# Patient Record
Sex: Female | Born: 1985 | Race: Black or African American | Hispanic: Refuse to answer | Marital: Single | Smoking: Never smoker
Health system: Southern US, Community
[De-identification: ages and names within clinical notes are randomized; demographics above are authoritative.]

## PROBLEM LIST (undated history)

## (undated) DIAGNOSIS — D709 Neutropenia, unspecified: Secondary | ICD-10-CM

## (undated) HISTORY — PX: WISDOM TOOTH EXTRACTION: SHX21

## (undated) HISTORY — PX: OTHER SURGICAL HISTORY: SHX169

## (undated) HISTORY — DX: Neutropenia, unspecified: D70.9

---

## 2014-02-07 ENCOUNTER — Other Ambulatory Visit (HOSPITAL_COMMUNITY): Payer: Self-pay | Admitting: Family Medicine

## 2014-02-07 DIAGNOSIS — IMO0002 Reserved for concepts with insufficient information to code with codable children: Secondary | ICD-10-CM

## 2014-02-07 DIAGNOSIS — R229 Localized swelling, mass and lump, unspecified: Principal | ICD-10-CM

## 2014-02-14 ENCOUNTER — Other Ambulatory Visit (HOSPITAL_COMMUNITY): Payer: Self-pay | Admitting: Family Medicine

## 2014-02-14 ENCOUNTER — Ambulatory Visit (HOSPITAL_COMMUNITY)
Admission: RE | Admit: 2014-02-14 | Discharge: 2014-02-14 | Disposition: A | Discharge status: Home / Self Care | Payer: 59 | Source: Ambulatory Visit | Attending: Family Medicine | Admitting: Family Medicine

## 2014-02-14 DIAGNOSIS — IMO0002 Reserved for concepts with insufficient information to code with codable children: Secondary | ICD-10-CM

## 2014-02-14 DIAGNOSIS — N63 Unspecified lump in breast: Secondary | ICD-10-CM | POA: Insufficient documentation

## 2014-02-14 DIAGNOSIS — R229 Localized swelling, mass and lump, unspecified: Principal | ICD-10-CM

## 2014-09-25 ENCOUNTER — Other Ambulatory Visit (HOSPITAL_COMMUNITY): Payer: Self-pay | Admitting: Family Medicine

## 2014-09-25 DIAGNOSIS — N63 Unspecified lump in unspecified breast: Secondary | ICD-10-CM

## 2014-10-17 ENCOUNTER — Ambulatory Visit (HOSPITAL_COMMUNITY)
Admission: RE | Admit: 2014-10-17 | Discharge: 2014-10-17 | Disposition: A | Discharge status: Home / Self Care | Payer: 59 | Source: Ambulatory Visit | Attending: Family Medicine | Admitting: Family Medicine

## 2014-10-17 ENCOUNTER — Encounter (HOSPITAL_COMMUNITY): Payer: 59

## 2014-10-17 ENCOUNTER — Other Ambulatory Visit (HOSPITAL_COMMUNITY): Payer: Self-pay | Admitting: Physician Assistant

## 2014-10-17 ENCOUNTER — Ambulatory Visit (HOSPITAL_COMMUNITY)
Admission: RE | Admit: 2014-10-17 | Discharge: 2014-10-17 | Disposition: A | Discharge status: Home / Self Care | Payer: 59 | Source: Ambulatory Visit | Attending: Physician Assistant | Admitting: Physician Assistant

## 2014-10-17 ENCOUNTER — Ambulatory Visit (HOSPITAL_COMMUNITY): Payer: 59

## 2014-10-17 ENCOUNTER — Other Ambulatory Visit (HOSPITAL_COMMUNITY): Payer: Self-pay | Admitting: Family Medicine

## 2014-10-17 DIAGNOSIS — N63 Unspecified lump in unspecified breast: Secondary | ICD-10-CM

## 2014-10-17 DIAGNOSIS — N6310 Unspecified lump in the right breast, unspecified quadrant: Secondary | ICD-10-CM

## 2014-10-17 MED ORDER — LIDOCAINE-EPINEPHRINE 1 %-1:200000 IJ SOLN
INTRAMUSCULAR | Status: AC
Start: 2014-10-17 — End: 2014-10-17
  Administered 2014-10-17: 10 mL
  Filled 2014-10-17: qty 10

## 2014-10-17 MED ORDER — LIDOCAINE HCL (PF) 2 % IJ SOLN
INTRAMUSCULAR | Status: AC
  Filled 2014-10-17: qty 10

## 2014-10-17 NOTE — Discharge Instructions (Signed)
Breast Biopsy °Care After °These instructions give you information on caring for yourself after your procedure. Your doctor may also give you more specific instructions. Call your doctor if you have any problems or questions after your procedure. °HOME CARE °· Only take medicine as told by your doctor. °· Do not take aspirin. °· Keep your sutures (stitches) dry when bathing. °· Protect the biopsy area. Do not let the area get bumped. °· Avoid activities that could pull the biopsy site open until your doctor approves. This includes: °· Stretching. °· Reaching. °· Exercise. °· Sports. °· Lifting more than 3lb. °· Continue your normal diet. °· Wear a good support bra for as long as told by your doctor. °· Change any bandages (dressings) as told by your doctor. °· Do not drink alcohol while taking pain medicine. °· Keep all doctor visits as told. Ask when your test results will be ready. Make sure you get your test results. °GET HELP RIGHT AWAY IF:  °· You have a fever. °· You have more bleeding (more than a small spot) from the biopsy site. °· You have trouble breathing. °· You have yellowish-white fluid (pus) coming from the biopsy site. °· You have redness, puffiness (swelling), or more pain in the biopsy site. °· You have a bad smell coming from the biopsy site. °· Your biopsy site opens after sutures, staples, or sticky strips have been removed. °· You have a rash. °· You need stronger medicine. °MAKE SURE YOU: °· Understand these instructions. °· Will watch your condition. °· Will get help right away if you are not doing well or get worse. °Document Released: 02/22/2009 Document Revised: 07/21/2011 Document Reviewed: 06/08/2011 °ExitCare® Patient Information ©2015 [employer]. This information is not intended to replace advice given to you by your health care provider. Make sure you discuss any questions you have with your health care provider. ° °Breast Biopsy °A breast biopsy is a test during which a sample of  tissue is taken from your breast. The breast tissue is looked at under a microscope for cancer cells.  °BEFORE THE PROCEDURE °· Make plans to have someone drive you home after the test. °· Do not smoke for 2 weeks before the test. Stop smoking, if you smoke. °· Do not drink alcohol for 24 hours before the test. °· Wear a good support bra to the test. °PROCEDURE  °You may be given one of the following: °· A medicine to numb the breast area (local anesthetic). °· A medicine to make you fall asleep (general anesthetic). °There are different types of breast biopsies. They include: °· Fine-needle aspiration. °¨ A needle is put into the breast lump. °¨ The needle takes out fluid and cells from the lump. °¨ Ultrasound imaging may be used to help find the lump and to put the needle in the right spot. °· Core-needle biopsy. °¨ A needle is put into the breast lump. °¨ The needle is put in your breast 3-6 times. °¨ The needle removes breast tissue. °¨ An ultrasound image or X-ray is often used to find the right spot to put in the needle. °· Stereotactic biopsy. °¨ X-rays and a computer are used to study X-ray pictures of the breast lump. °¨ The computer finds where the needle needs to be put into the breast. °¨ Tissue samples are taken out. °· Vacuum-assisted biopsy. °¨ A small cut (incision) is made in your breast. °¨ A biopsy device is put through the cut and into the breast   tissue. °¨ The biopsy device draws abnormal breast tissue into the biopsy device. °¨ A large tissue sample is often removed. °¨ No stitches are needed. °· Ultrasound-guided core-needle biopsy. °¨ Ultrasound imaging helps guide the needle into the area of the breast that is not normal. °¨ A cut is made in the breast. The needle is put into the breast lump. °¨ Tissue samples are taken out. °· Open biopsy. °¨ A large cut is made in the breast. °¨ Your doctor will try to remove the whole breast lump or as much as possible. °All tissue, fluid, or cell samples  are looked at under a microscope.  °AFTER THE PROCEDURE °· You will be taken to an area to recover. You will be able to go home once you are doing well and are without problems. °· You may have bruising on your breast. This is normal. °· A pressure bandage (dressing) may be put on your breast for 24-48 hours. This type of bandage is wrapped tightly around your chest. It helps stop fluid from building up underneath tissues. °Document Released: 07/21/2011 Document Revised: 09/12/2013 Document Reviewed: 07/21/2011 °ExitCare® Patient Information ©2015 [employer]. This information is not intended to replace advice given to you by your health care provider. Make sure you discuss any questions you have with your health care provider. ° °

## 2014-11-27 IMAGING — US US BREAST LTD UNI RIGHT INC AXILLA
1 series · 2 of 2 positions shown · non-contrast
Comparison: None.

CLINICAL DATA: 28-year-old female complaining of a palpable
abnormality in the 1 o'clock subareolar region of the right breast.

EXAM:
ULTRASOUND OF THE RIGHT BREAST

[Series 1: us breast ltd uni right inc axilla · 0.05mm/px · 2 of 2 slices shown]
[im 1/2]
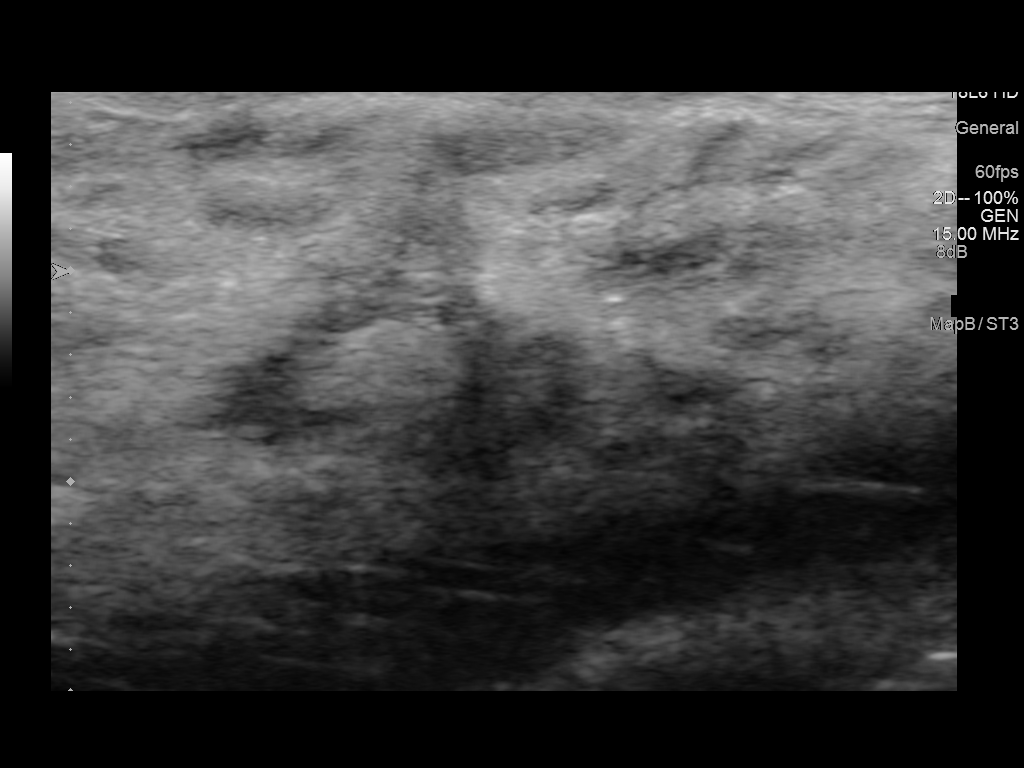
[im 2/2]
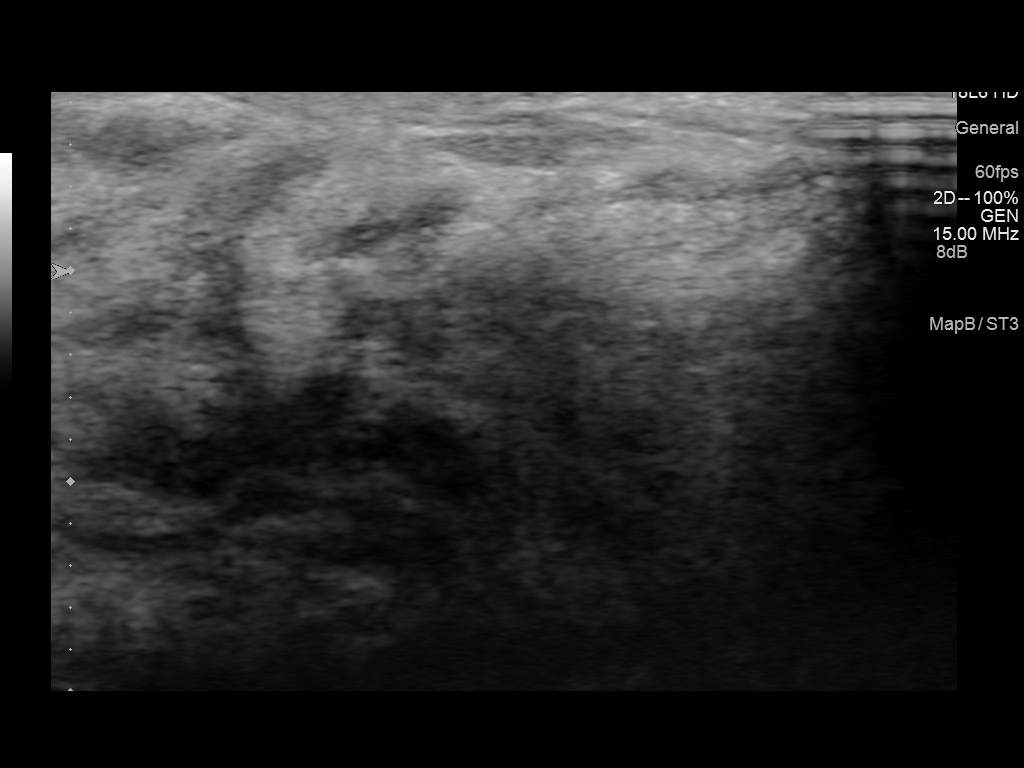

[2 of 2 positions shown; findings below may reference images not displayed]

FINDINGS: On physical exam, I palpate mild thickening in the right breast at 1
o'clock in the subareolar location.

Ultrasound is performed, showing normal tissue in the 1 o'clock
subareolar region of the right breast. No solid or cystic mass,
abnormal shadowing or distortion visualized.
IMPRESSION: No sonographic evidence of malignancy in the right breast.

RECOMMENDATION:
Short-term interval followup clinical exam and ultrasound of the
right breast in 6 months is recommended for the palpable thickening.
The importance of self-breast examination was discussed with the
patient.

I have discussed the findings and recommendations with the patient.
Results were also provided in writing at the conclusion of the
visit. If applicable, a reminder letter will be sent to the patient
regarding the next appointment.

BI-RADS CATEGORY  3: Probably benign finding(s) - short interval
follow-up suggested.

## 2015-07-27 DIAGNOSIS — Z0000 Encounter for general adult medical examination without abnormal findings: Secondary | ICD-10-CM | POA: Diagnosis not present

## 2015-08-01 DIAGNOSIS — H5213 Myopia, bilateral: Secondary | ICD-10-CM | POA: Diagnosis not present

## 2015-08-07 DIAGNOSIS — Z1389 Encounter for screening for other disorder: Secondary | ICD-10-CM | POA: Diagnosis not present

## 2015-08-07 DIAGNOSIS — Z0000 Encounter for general adult medical examination without abnormal findings: Secondary | ICD-10-CM | POA: Diagnosis not present

## 2015-08-07 DIAGNOSIS — N6021 Fibroadenosis of right breast: Secondary | ICD-10-CM | POA: Diagnosis not present

## 2015-08-08 ENCOUNTER — Other Ambulatory Visit (HOSPITAL_COMMUNITY): Payer: Self-pay | Admitting: Physician Assistant

## 2015-08-08 DIAGNOSIS — N6310 Unspecified lump in the right breast, unspecified quadrant: Secondary | ICD-10-CM

## 2015-08-14 ENCOUNTER — Other Ambulatory Visit (HOSPITAL_COMMUNITY): Payer: 59

## 2015-08-14 ENCOUNTER — Ambulatory Visit (HOSPITAL_COMMUNITY)
Admission: RE | Admit: 2015-08-14 | Discharge: 2015-08-14 | Disposition: A | Discharge status: Home / Self Care | Payer: 59 | Source: Ambulatory Visit | Attending: Physician Assistant | Admitting: Physician Assistant

## 2015-08-14 DIAGNOSIS — N63 Unspecified lump in breast: Secondary | ICD-10-CM | POA: Insufficient documentation

## 2015-08-14 DIAGNOSIS — N6310 Unspecified lump in the right breast, unspecified quadrant: Secondary | ICD-10-CM

## 2016-09-25 DIAGNOSIS — H5213 Myopia, bilateral: Secondary | ICD-10-CM | POA: Diagnosis not present

## 2016-09-25 DIAGNOSIS — H52223 Regular astigmatism, bilateral: Secondary | ICD-10-CM | POA: Diagnosis not present

## 2016-12-10 DIAGNOSIS — L42 Pityriasis rosea: Secondary | ICD-10-CM | POA: Diagnosis not present

## 2016-12-10 DIAGNOSIS — Z681 Body mass index (BMI) 19 or less, adult: Secondary | ICD-10-CM | POA: Diagnosis not present

## 2017-05-04 IMAGING — US US BREAST LTD UNI RIGHT INC AXILLA
1 series · 9 of 9 positions shown · non-contrast
Comparison: Previous exam(s).

CLINICAL DATA: Patient returns for follow-up of the right breast.
She reports improvement in the thickening of the retroareolar region
of the right breast but reports that the area of concern has moved
to lower portion of the right breast. She reports no history of
breast feeding or pregnancy. Minimal tenderness.

EXAM:
ULTRASOUND OF THE RIGHT BREAST

[Series 1: us breast ltd uni right inc axilla · 0.06mm/px · 9 of 9 slices shown]
[im 1/9]
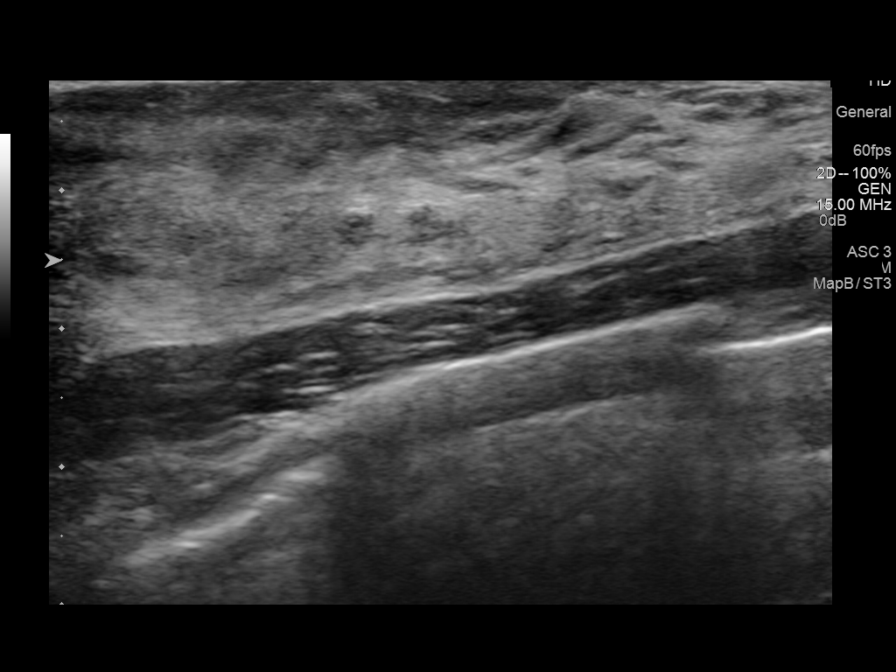
[im 2/9]
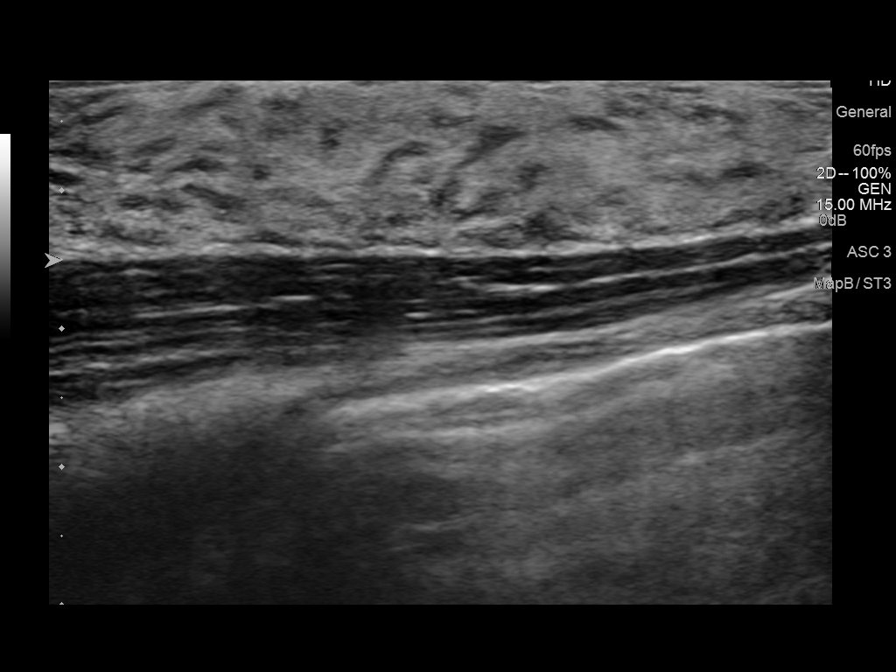
[im 3/9]
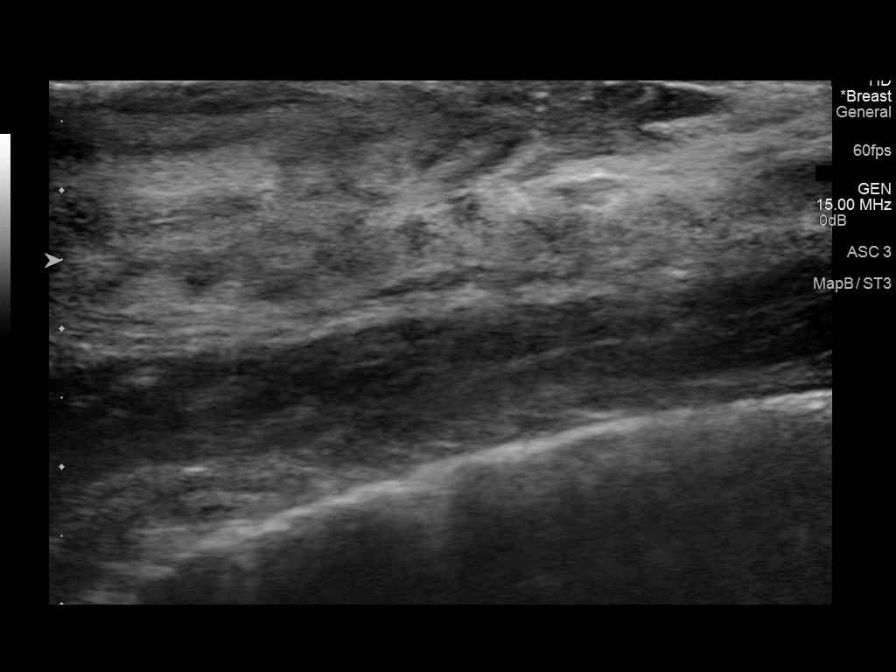
[im 4/9]
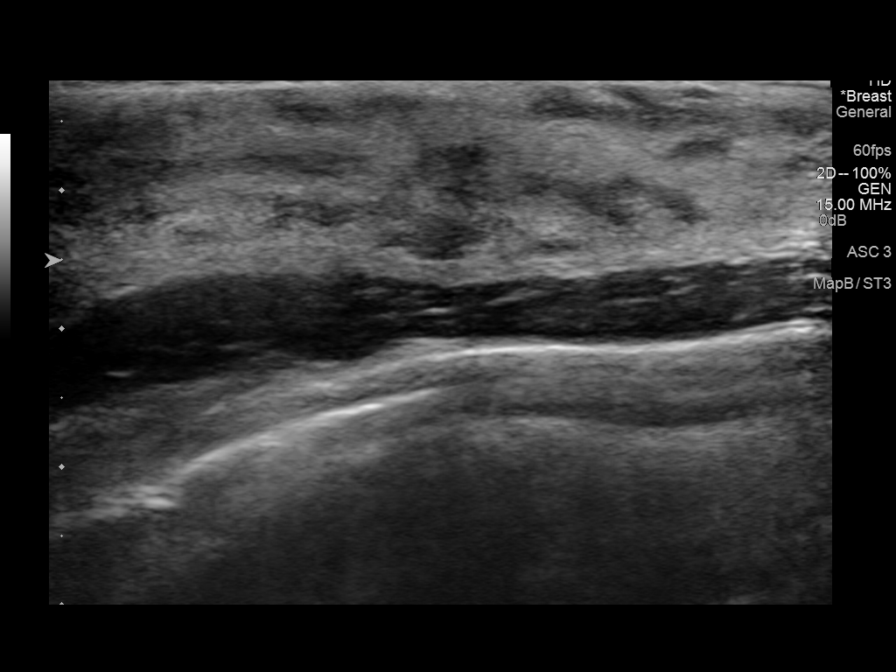
[im 5/9]
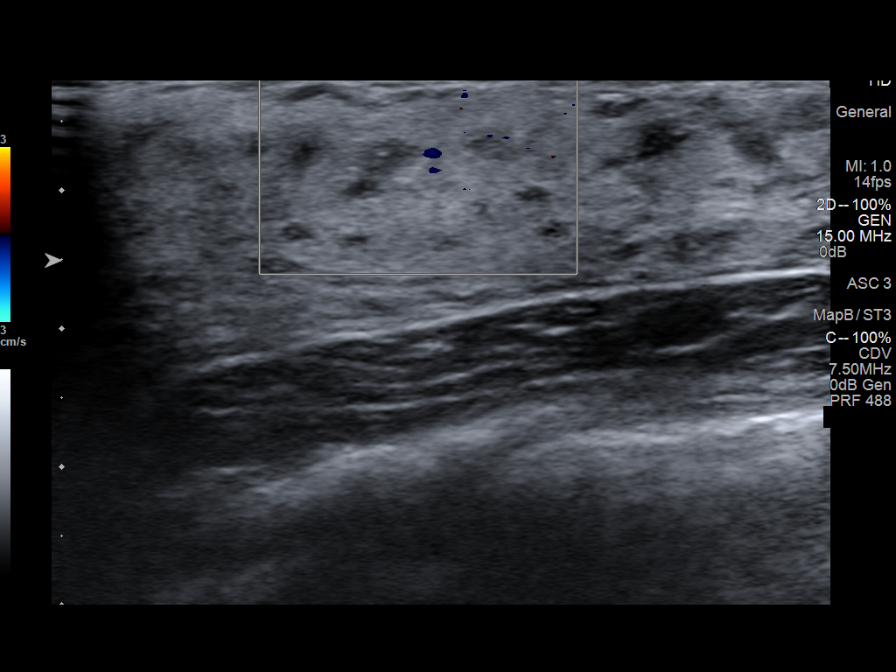
[im 6/9]
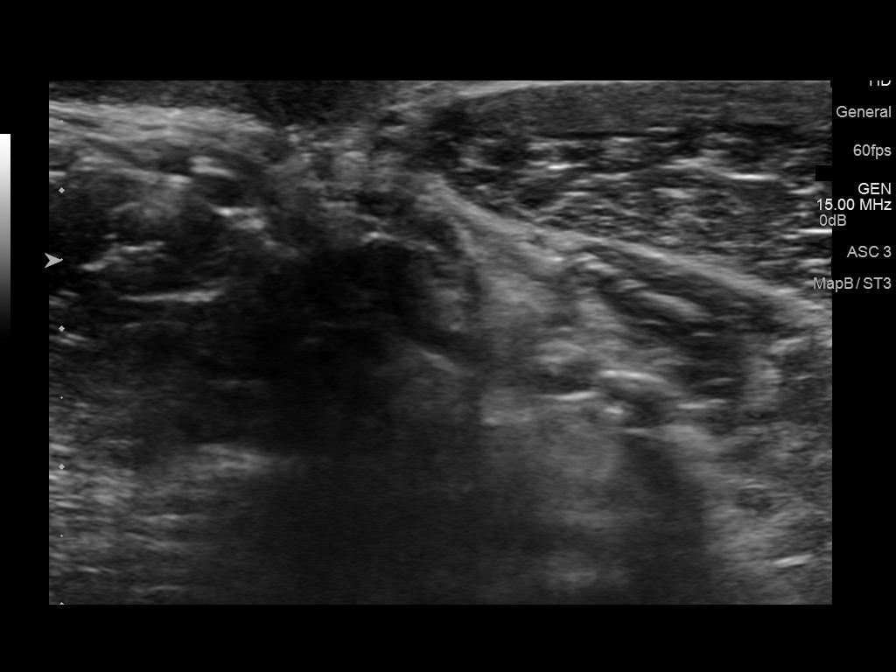
[im 7/9]
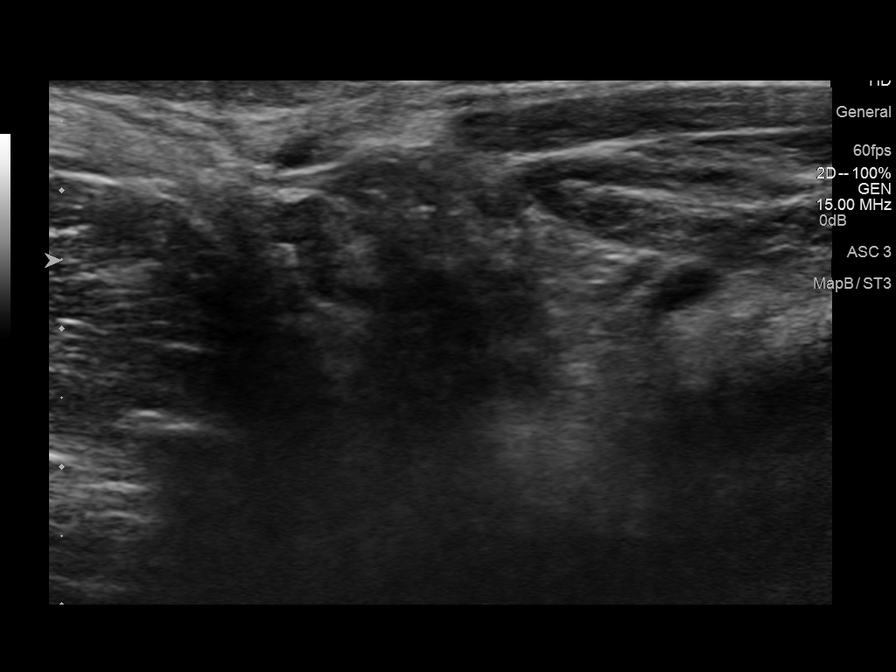
[im 8/9]
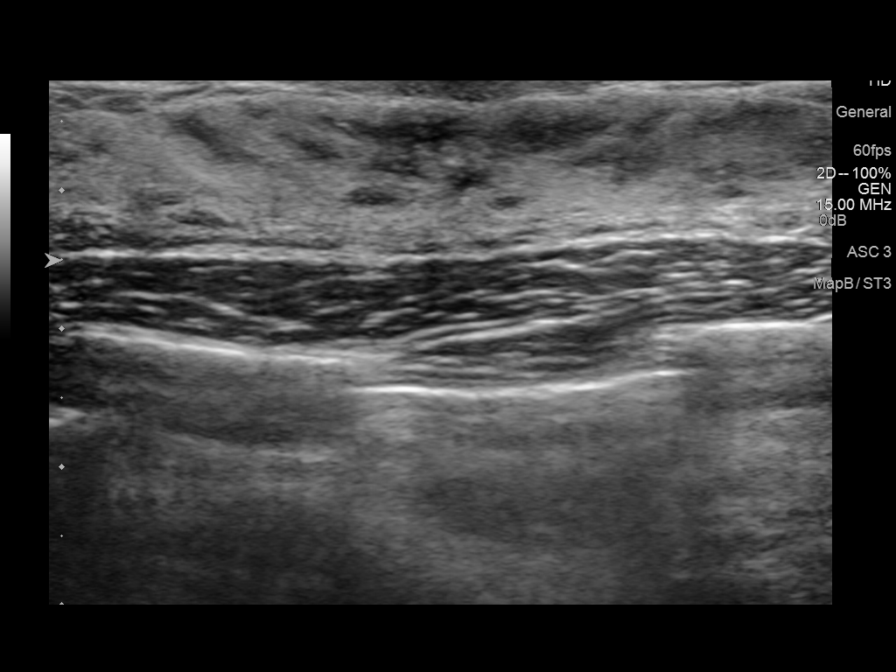
[im 9/9]
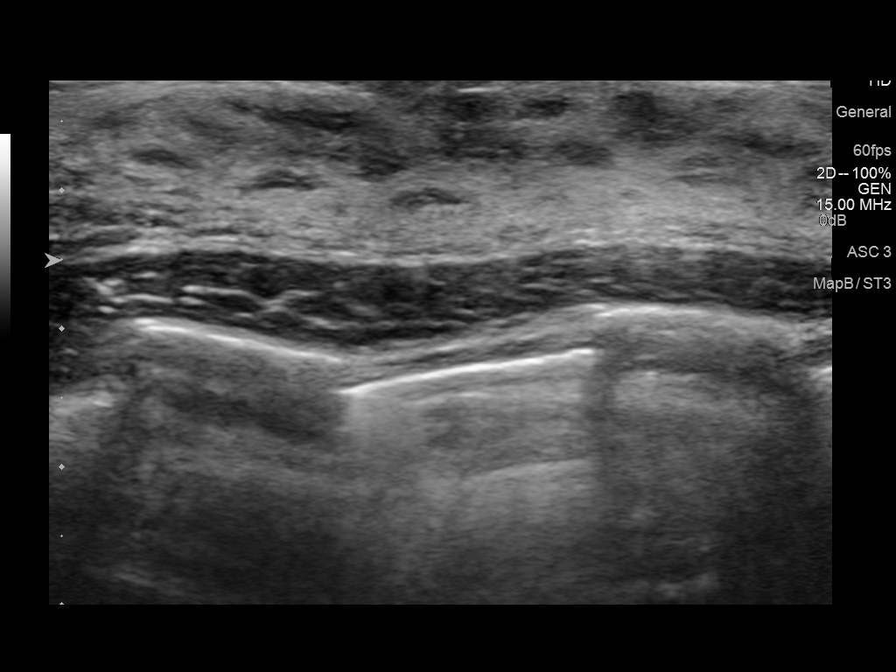

[9 of 9 positions shown; findings below may reference images not displayed]

FINDINGS: On physical exam, I palpate discrete plaque-like thickening in the
immediate retroareolar region of the right breast. There is a focal
firm nodule in the 7 o'clock location of the right breast
approximately 1 cm from the nipple. There is no erythema or nipple
change. By contrast the left breast is soft without focal areas of
thickening.

Targeted ultrasound is performed, showing dense fibroglandular
tissue throughout the retroareolar region and 7 o'clock locations.
No mass, distortion, or acoustic shadowing is demonstrated with
ultrasound. Evaluation of the right axilla is negative for
adenopathy.
IMPRESSION: 1. Extremely dense fibroglandular tissue is imaged in the areas of
concern in the retroareolar and 7 o'clock locations of the right
breast.
2. The firm palpable abnormality in the 7 o'clock location of the
right breast is of some concern. Biopsy should be considered.
3. I discussed the findings and recommendations with the patient.
She requests biopsy. This will be performed on the same day and
dictated separately.

RECOMMENDATION:
Ultrasound-guided core biopsy of palpable mass in the 7 o'clock
location of the right breast.

I have discussed the findings and recommendations with the patient.
Results were also provided in writing at the conclusion of the
visit. If applicable, a reminder letter will be sent to the patient
regarding the next appointment.

BI-RADS CATEGORY  4: Suspicious.

## 2017-09-03 DIAGNOSIS — Z0000 Encounter for general adult medical examination without abnormal findings: Secondary | ICD-10-CM | POA: Diagnosis not present

## 2017-09-03 DIAGNOSIS — Z681 Body mass index (BMI) 19 or less, adult: Secondary | ICD-10-CM | POA: Diagnosis not present

## 2017-10-02 DIAGNOSIS — H52223 Regular astigmatism, bilateral: Secondary | ICD-10-CM | POA: Diagnosis not present

## 2017-10-02 DIAGNOSIS — H5213 Myopia, bilateral: Secondary | ICD-10-CM | POA: Diagnosis not present

## 2018-03-01 IMAGING — US US BREAST*R* LIMITED INC AXILLA
1 series · 2 of 2 positions shown · non-contrast
Comparison: Previous exam(s).

CLINICAL DATA: Six-month follow-up. Ultrasound-guided core biopsy
10/17/2014 performed of discrete palpable thickening in the 7
o'clock location of the right breast demonstrated fibrocystic
changes and dense stromal fibrosis without malignancy. Patient
reports she can still feel an abnormality in the right breast.

EXAM:
ULTRASOUND OF THE RIGHT BREAST

[Series 1: us breast*right* limited inc axilla · 0.07mm/px · 2 of 2 slices shown]
[im 1/2]
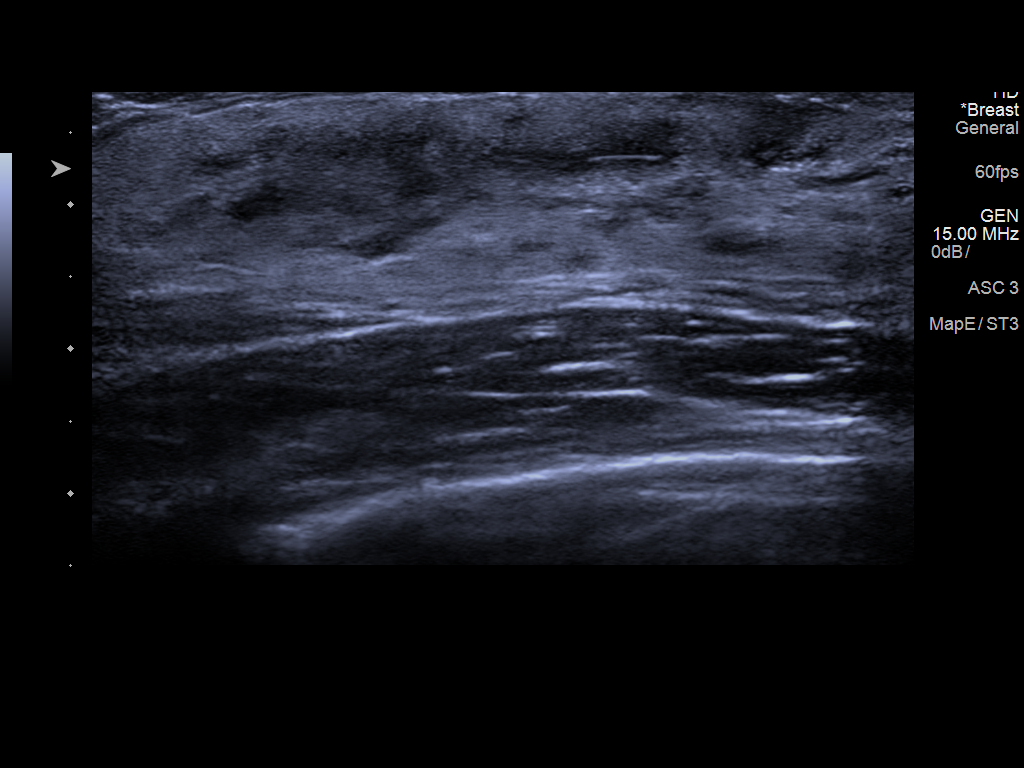
[im 2/2]
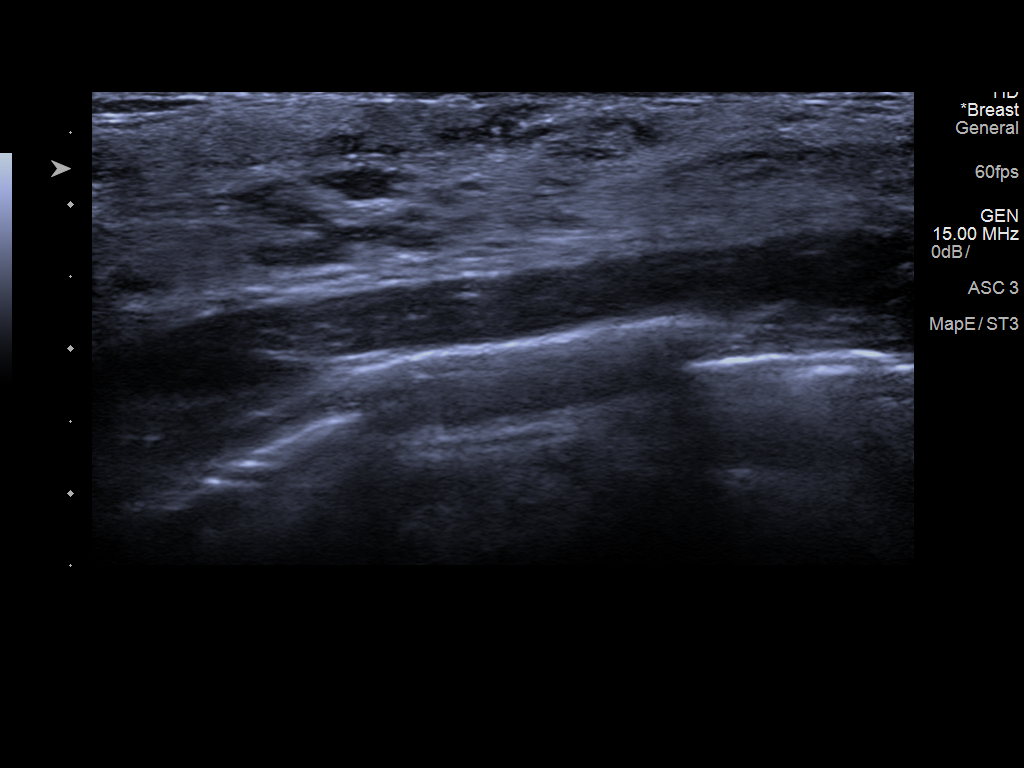

[2 of 2 positions shown; findings below may reference images not displayed]

FINDINGS: On physical exam, I palpate soft edge of parenchymal thickening in
the lateral aspect of the right breast, 8-9 o'clock. I palpate no
discrete mass in the 7 o'clock location or retroareolar region of
the right breast as before.

Targeted ultrasound is performed, showing normal appearing dense
fibroglandular tissue in the lower and lateral portions of the right
breast. No mass, distortion, or acoustic shadowing is demonstrated
with ultrasound.
IMPRESSION: 1. No sonographic abnormality identified.
2. No suspicious abnormalities identified on physical exam today.
3. Given the benign histology on recent biopsy, no specific
follow-up is felt to be necessary unless there are further changes.

RECOMMENDATION:
Clinical followup.

Screening mammogram at age 42 unless there are persistent or
intervening clinical concerns. (Code:[id number]-[id number])

I have discussed the findings and recommendations with the patient.
Results were also provided in writing at the conclusion of the
visit. If applicable, a reminder letter will be sent to the patient
regarding the next appointment.

BI-RADS CATEGORY  1: Negative.

## 2018-11-09 DIAGNOSIS — H5213 Myopia, bilateral: Secondary | ICD-10-CM | POA: Diagnosis not present

## 2018-11-09 DIAGNOSIS — H52222 Regular astigmatism, left eye: Secondary | ICD-10-CM | POA: Diagnosis not present

## 2018-12-01 DIAGNOSIS — Z0000 Encounter for general adult medical examination without abnormal findings: Secondary | ICD-10-CM | POA: Diagnosis not present

## 2018-12-01 DIAGNOSIS — R102 Pelvic and perineal pain: Secondary | ICD-10-CM | POA: Diagnosis not present

## 2018-12-01 DIAGNOSIS — Z681 Body mass index (BMI) 19 or less, adult: Secondary | ICD-10-CM | POA: Diagnosis not present

## 2018-12-02 ENCOUNTER — Other Ambulatory Visit (HOSPITAL_COMMUNITY): Payer: Self-pay | Admitting: Physician Assistant

## 2018-12-02 DIAGNOSIS — R102 Pelvic and perineal pain: Secondary | ICD-10-CM

## 2018-12-07 ENCOUNTER — Ambulatory Visit (HOSPITAL_COMMUNITY): Payer: 59

## 2018-12-08 ENCOUNTER — Other Ambulatory Visit: Payer: Self-pay

## 2018-12-08 ENCOUNTER — Ambulatory Visit (HOSPITAL_COMMUNITY)
Admission: RE | Admit: 2018-12-08 | Discharge: 2018-12-08 | Disposition: A | Discharge status: Home / Self Care | Payer: 59 | Source: Ambulatory Visit | Attending: Physician Assistant | Admitting: Physician Assistant

## 2018-12-08 DIAGNOSIS — R102 Pelvic and perineal pain: Secondary | ICD-10-CM | POA: Insufficient documentation

## 2018-12-08 DIAGNOSIS — R1031 Right lower quadrant pain: Secondary | ICD-10-CM | POA: Diagnosis not present

## 2018-12-14 DIAGNOSIS — R102 Pelvic and perineal pain: Secondary | ICD-10-CM | POA: Diagnosis not present

## 2019-02-09 DIAGNOSIS — R1031 Right lower quadrant pain: Secondary | ICD-10-CM | POA: Diagnosis not present

## 2019-07-19 DIAGNOSIS — R109 Unspecified abdominal pain: Secondary | ICD-10-CM | POA: Diagnosis not present

## 2019-12-05 DIAGNOSIS — Z681 Body mass index (BMI) 19 or less, adult: Secondary | ICD-10-CM | POA: Diagnosis not present

## 2019-12-05 DIAGNOSIS — Z0000 Encounter for general adult medical examination without abnormal findings: Secondary | ICD-10-CM | POA: Diagnosis not present

## 2019-12-05 DIAGNOSIS — R519 Headache, unspecified: Secondary | ICD-10-CM | POA: Diagnosis not present

## 2019-12-28 DIAGNOSIS — D72819 Decreased white blood cell count, unspecified: Secondary | ICD-10-CM | POA: Diagnosis not present

## 2020-01-25 DIAGNOSIS — D72819 Decreased white blood cell count, unspecified: Secondary | ICD-10-CM | POA: Diagnosis not present

## 2020-02-08 DIAGNOSIS — H5213 Myopia, bilateral: Secondary | ICD-10-CM | POA: Diagnosis not present

## 2020-02-15 DIAGNOSIS — D708 Other neutropenia: Secondary | ICD-10-CM | POA: Diagnosis not present

## 2020-02-18 ENCOUNTER — Other Ambulatory Visit: Payer: 59

## 2020-02-18 ENCOUNTER — Other Ambulatory Visit: Payer: Self-pay | Admitting: Internal Medicine

## 2020-02-18 DIAGNOSIS — Z20822 Contact with and (suspected) exposure to covid-19: Secondary | ICD-10-CM

## 2020-02-20 LAB — NOVEL CORONAVIRUS, NAA: SARS-CoV-2, NAA: NOT DETECTED

## 2020-02-20 LAB — SARS-COV-2, NAA 2 DAY TAT

## 2020-02-29 DIAGNOSIS — N6019 Diffuse cystic mastopathy of unspecified breast: Secondary | ICD-10-CM | POA: Diagnosis not present

## 2020-02-29 DIAGNOSIS — D72810 Lymphocytopenia: Secondary | ICD-10-CM | POA: Diagnosis not present

## 2020-02-29 DIAGNOSIS — R519 Headache, unspecified: Secondary | ICD-10-CM | POA: Diagnosis not present

## 2020-03-21 DIAGNOSIS — D709 Neutropenia, unspecified: Secondary | ICD-10-CM | POA: Diagnosis not present

## 2020-03-21 DIAGNOSIS — D72810 Lymphocytopenia: Secondary | ICD-10-CM | POA: Diagnosis not present

## 2020-03-28 DIAGNOSIS — D72810 Lymphocytopenia: Secondary | ICD-10-CM | POA: Diagnosis not present

## 2020-06-28 DIAGNOSIS — D709 Neutropenia, unspecified: Secondary | ICD-10-CM | POA: Diagnosis not present

## 2020-06-28 DIAGNOSIS — D72810 Lymphocytopenia: Secondary | ICD-10-CM | POA: Diagnosis not present

## 2020-06-28 DIAGNOSIS — Z1379 Encounter for other screening for genetic and chromosomal anomalies: Secondary | ICD-10-CM | POA: Diagnosis not present

## 2021-03-13 DIAGNOSIS — H5213 Myopia, bilateral: Secondary | ICD-10-CM | POA: Diagnosis not present

## 2021-06-24 DIAGNOSIS — D72810 Lymphocytopenia: Secondary | ICD-10-CM | POA: Diagnosis not present

## 2021-06-24 DIAGNOSIS — Z1379 Encounter for other screening for genetic and chromosomal anomalies: Secondary | ICD-10-CM | POA: Diagnosis not present

## 2021-06-24 DIAGNOSIS — D709 Neutropenia, unspecified: Secondary | ICD-10-CM | POA: Diagnosis not present

## 2021-06-25 IMAGING — US TRANSVAGINAL ULTRASOUND OF PELVIS
1 series · 14 of 25 positions shown · non-contrast
Comparison: None

CLINICAL DATA: RIGHT lower quadrant pelvic pain in a female
intermittently for 6 months

EXAM:
ULTRASOUND PELVIS TRANSVAGINAL
TECHNIQUE: Transvaginal ultrasound examination of the pelvis was performed
including evaluation of the uterus, ovaries, adnexal regions, and
pelvic cul-de-sac.

[Series 1: transvaginal ultrasound of pelvis · 0.13mm/px · 14 of 71 slices shown]
[im 1/71]
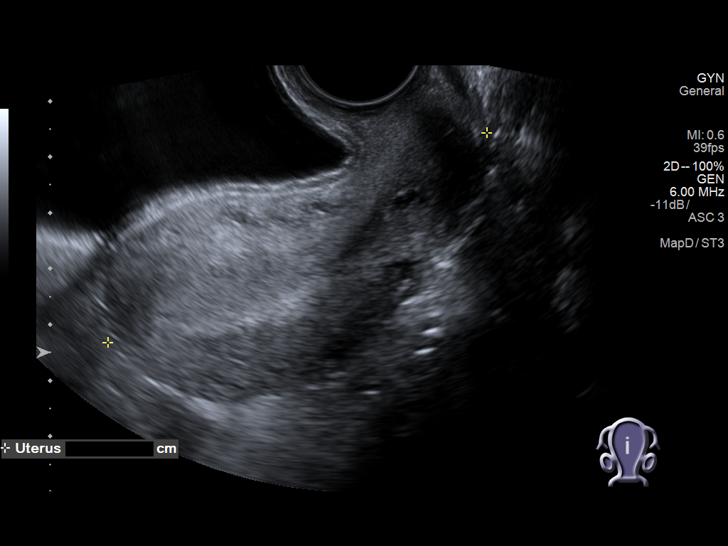
[im 6/71]
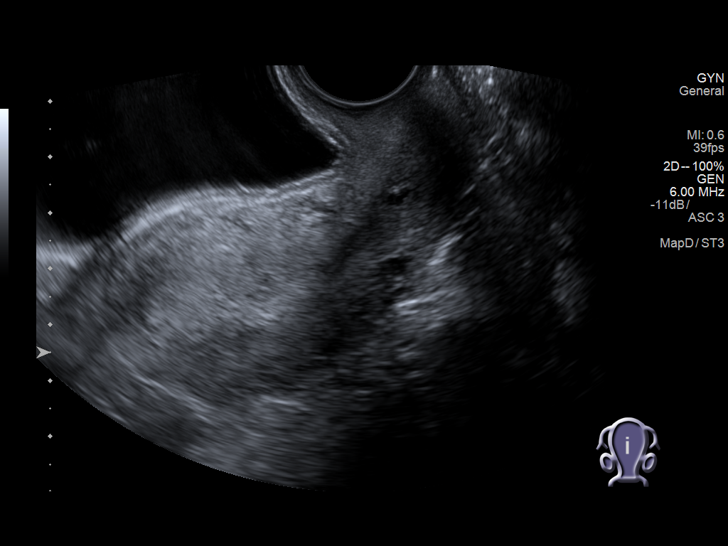
[im 12/71]
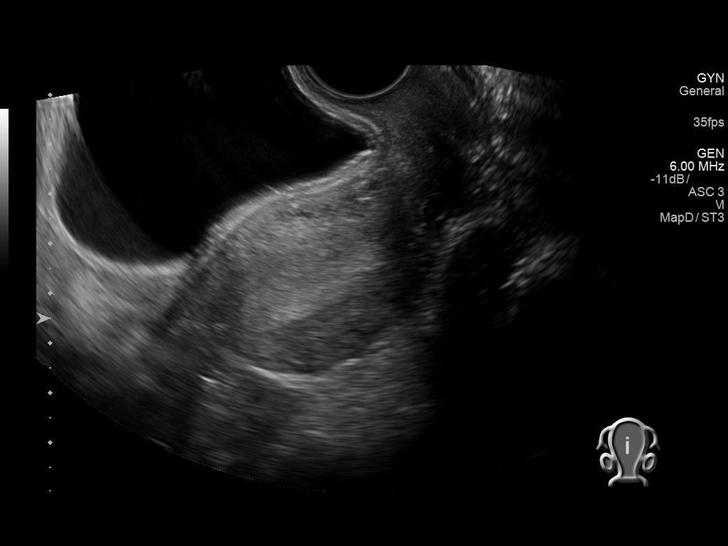
[im 18/71]
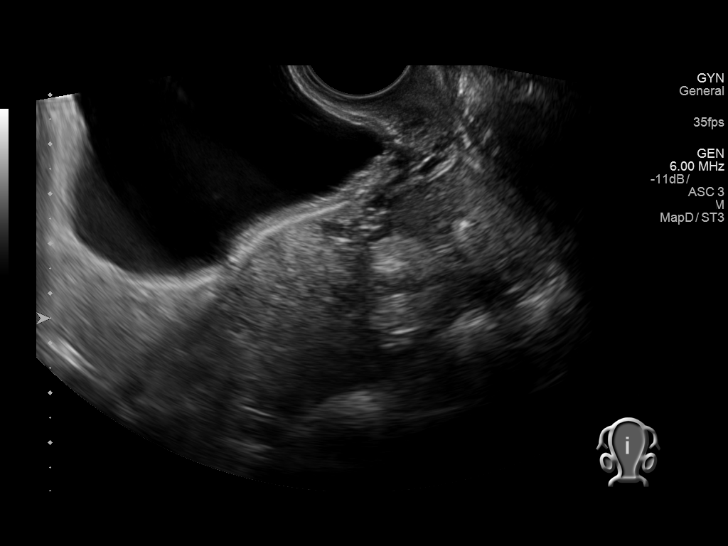
[im 24/71]
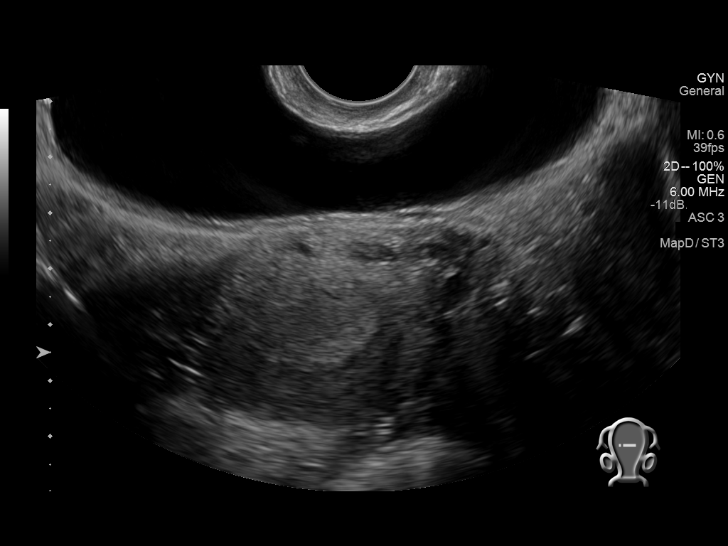
[im 27/71]
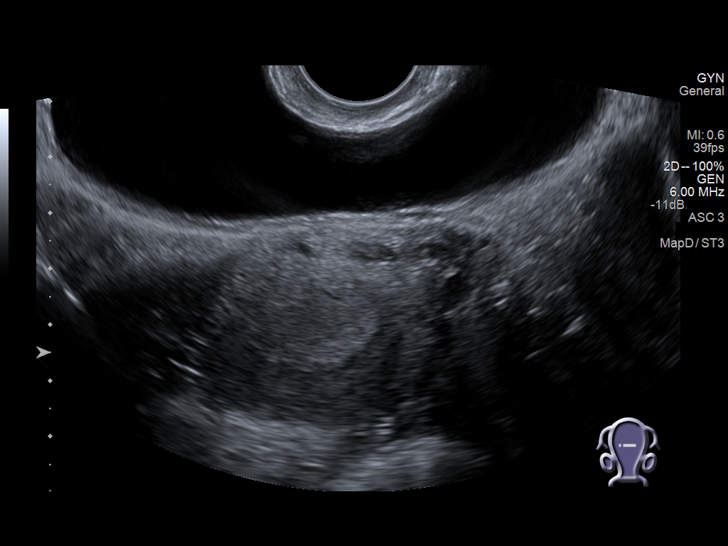
[im 33/71]
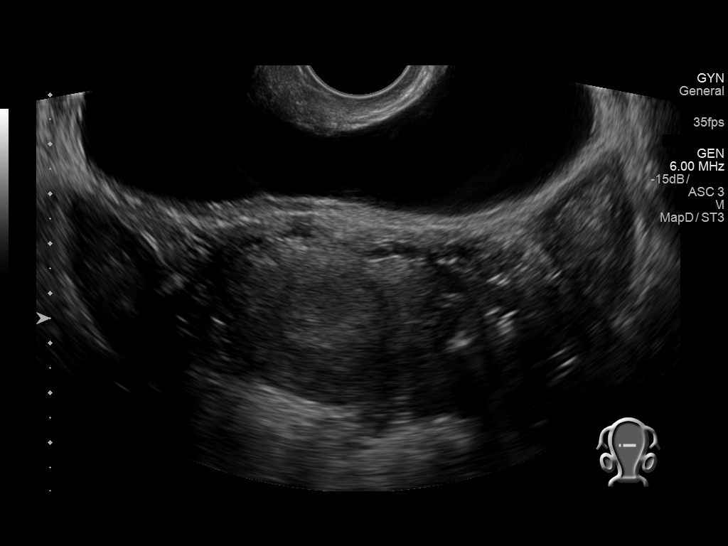
[im 38/71]
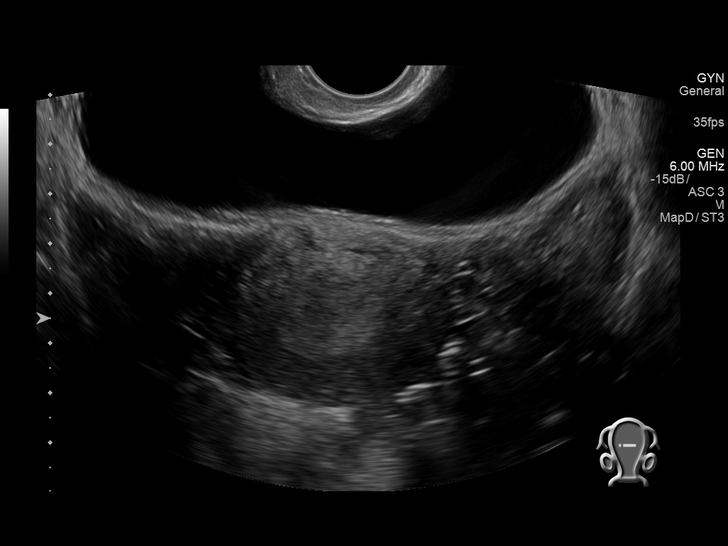
[im 44/71]
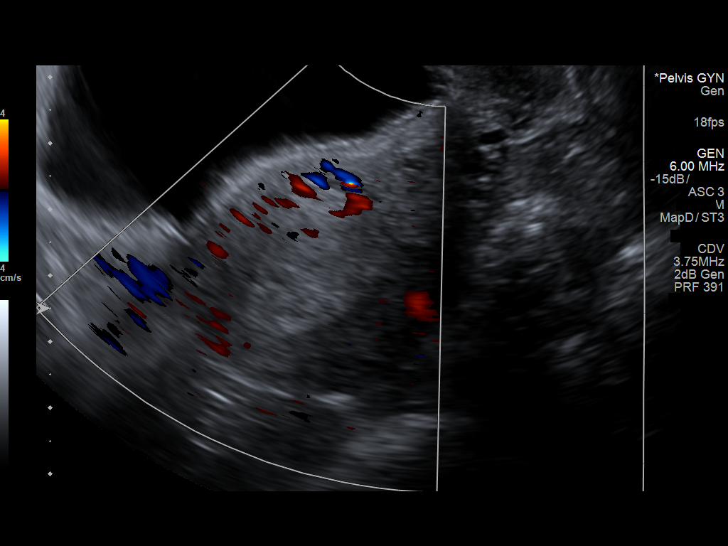
[im 47/71]
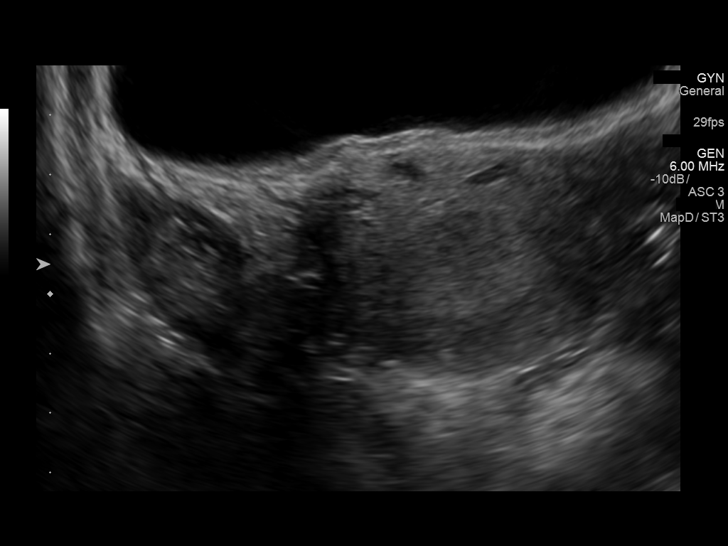
[im 53/71]
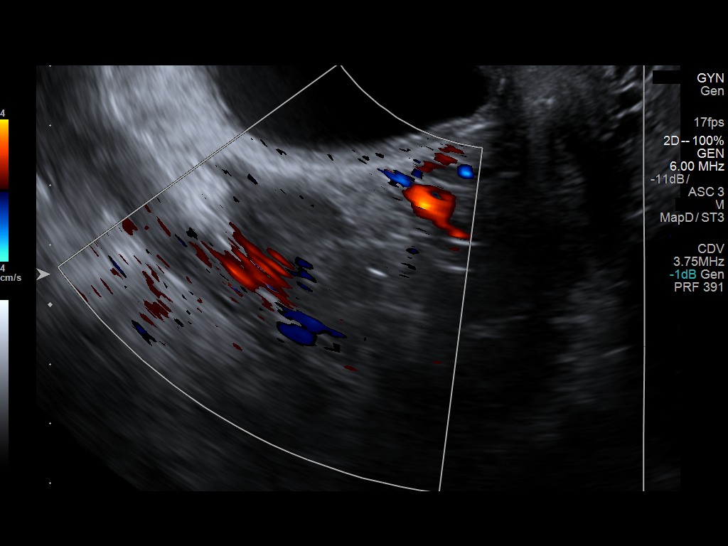
[im 59/71]
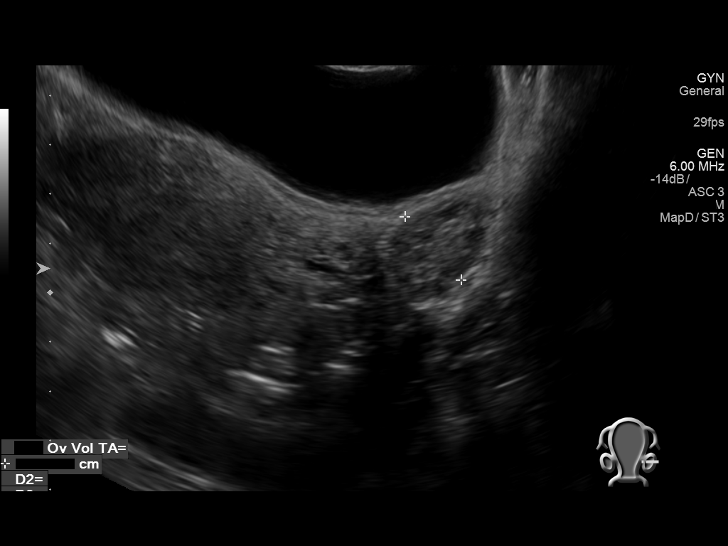
[im 65/71]
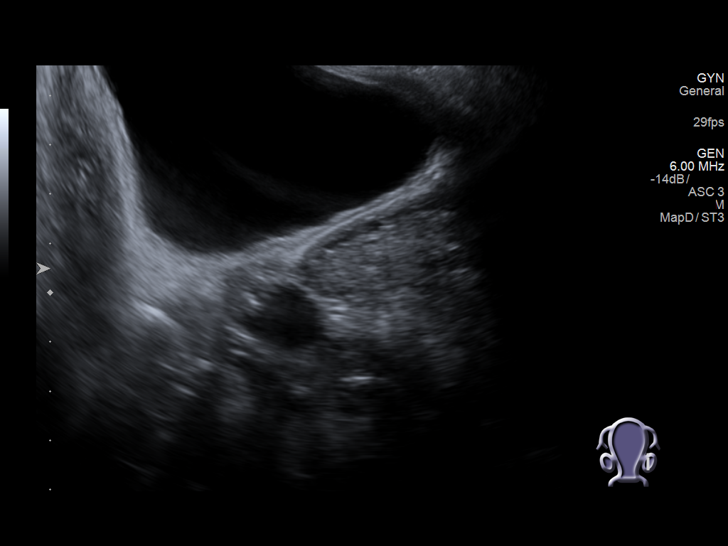
[im 71/71]
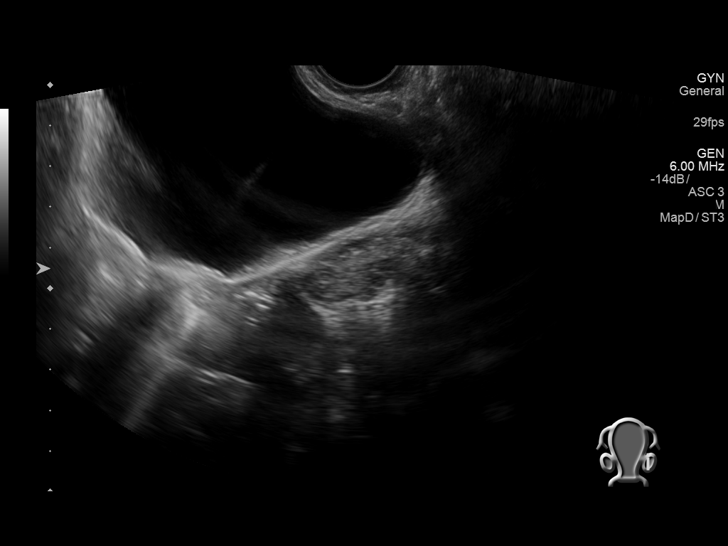

[14 of 25 positions shown; findings below may reference images not displayed]

FINDINGS: Uterus

Measurements: 7.7 x 3.6 x 3.3 cm = volume: 48 mL. Normal morphology
without mass

Endometrium

Thickness: 13 mm.  No endometrial fluid or focal abnormality

Right ovary

Measurements: 3.6 x 1.9 x 1.8 cm = volume: 6.6 mL. Normal morphology
without mass

Left ovary

Measurements: 4.3 x 1.8 x 1.7 cm = volume: 6.9 mL. Normal morphology
without mass

Other findings: No free pelvic fluid. No adnexal masses. Small
amount of blood was seen on the endovaginal probe following the
conclusion of the exam.
IMPRESSION: Small amount of blood on the endovaginal probe at the conclusion of
the exam.

Otherwise normal transvaginal pelvic ultrasound.

## 2021-06-28 DIAGNOSIS — D72810 Lymphocytopenia: Secondary | ICD-10-CM | POA: Diagnosis not present

## 2021-06-28 DIAGNOSIS — R1031 Right lower quadrant pain: Secondary | ICD-10-CM | POA: Diagnosis not present

## 2021-07-10 DIAGNOSIS — R1031 Right lower quadrant pain: Secondary | ICD-10-CM | POA: Diagnosis not present

## 2021-07-17 DIAGNOSIS — R1031 Right lower quadrant pain: Secondary | ICD-10-CM | POA: Diagnosis not present

## 2021-07-17 DIAGNOSIS — D709 Neutropenia, unspecified: Secondary | ICD-10-CM | POA: Diagnosis not present

## 2021-09-25 DIAGNOSIS — Z681 Body mass index (BMI) 19 or less, adult: Secondary | ICD-10-CM | POA: Diagnosis not present

## 2021-09-25 DIAGNOSIS — Z0000 Encounter for general adult medical examination without abnormal findings: Secondary | ICD-10-CM | POA: Diagnosis not present

## 2021-09-25 DIAGNOSIS — I10 Essential (primary) hypertension: Secondary | ICD-10-CM | POA: Diagnosis not present

## 2021-09-25 DIAGNOSIS — D72819 Decreased white blood cell count, unspecified: Secondary | ICD-10-CM | POA: Diagnosis not present

## 2021-10-10 ENCOUNTER — Encounter: Payer: Self-pay | Admitting: Internal Medicine

## 2021-11-19 ENCOUNTER — Encounter: Payer: Self-pay | Admitting: Gastroenterology

## 2021-11-19 ENCOUNTER — Ambulatory Visit (INDEPENDENT_AMBULATORY_CARE_PROVIDER_SITE_OTHER): Payer: 59 | Admitting: Gastroenterology

## 2021-11-19 DIAGNOSIS — R1031 Right lower quadrant pain: Secondary | ICD-10-CM

## 2021-11-19 DIAGNOSIS — G8929 Other chronic pain: Secondary | ICD-10-CM | POA: Insufficient documentation

## 2021-11-19 NOTE — H&P (View-Only) (Signed)
GI Office Note    Referring Provider: Burns Spain NP at [facility], [place] Primary Care Physician:  Rosine Door  Primary Gastroenterologist:  Chief Complaint   Chief Complaint  Patient presents with   Abdominal Pain    Feels like her abdomen is full in the right lower     History of Present Illness   Sara Gregory is a 36 y.o. female presenting today at the request Burns Spain NP for further evaluation of right lower quadrant abdominal pain.  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  Due to persistent right lower quadrant pain without known etiology, hematologist has recommended evaluation by GI including colonoscopy. She also has history of low absolute CD3 and CD4 count 6 with negative HIV/HTLV testing.  CT pelvis with contrast March 2023 showed no acute findings.  Appendix not visualized.  No pericecal inflammation. She has had at least two transvaginal ultrasounds that she reports unremarkable. One in 11/2018 and then another by her gyn in [place]. She states nothing has been found to explain her symptoms.   Symptoms present for about 3 years. She notes a fullness in RLQ. Associated with some discomfort, nagging type pain. Sometimes may be worse if does not have adequate BM but typically always present. Not necessarily worse with meals. Could be worse around time of her menstrual cycles. Her menses last about 7 days, occurring about every 35 days. Normal bleeding and mild cramping. She has a BM most days but never skips more than one day. She denies melena, brbpr. Sometimes has some extra gas but not always. No weight loss. No UGI symptoms. Patient has never had abdominal surgeries.   Medications   No current outpatient medications on file.   No current facility-administered medications for this visit.    Allergies   Allergies as of 11/19/2021   (No Known Allergies)     Past Medical History   Past Medical History:  Diagnosis Date   Neutropenia ([place])     Past Surgical History   Past Surgical History:  Procedure Laterality Date   none      Past Family History   Family History  Problem Relation Age of Onset   Breast cancer Mother    Bladder Cancer Father    Colon polyps Father        age less than 75   Colon cancer Neg Hx     Past Social History   Social History   Socioeconomic History   Marital status: Single    Spouse name: Not on file   Number of children: Not on file   Years of education: Not on file   Highest education level: Not on file  Occupational History   Not on file  Tobacco Use   Smoking status: Never    Passive exposure: Never   Smokeless tobacco: Never  Substance and Sexual Activity   Alcohol use: Not Currently   Drug use: Not Currently   Sexual activity: Not Currently  Other Topics Concern   Not on file  Social History Narrative   Not on file   Social Determinants of Health   Financial Resource Strain: Not on file  Food Insecurity: Not on file  Transportation Needs: Not on file  Physical Activity: Not on file  Stress: Not on file  Social Connections: Not on file  Intimate Partner Violence: Not on file    Review of  Systems   General: Negative for anorexia, weight loss, fever, chills, fatigue, weakness. Eyes: Negative for vision changes.  ENT: Negative for hoarseness, difficulty swallowing , nasal congestion. CV: Negative for chest pain, angina, palpitations, dyspnea on exertion, peripheral edema.  Respiratory: Negative for dyspnea at rest, dyspnea on exertion, cough, sputum, wheezing.  GI: See history of present illness. GU:  Negative for dysuria, hematuria, urinary incontinence, urinary frequency, nocturnal urination.  See hpi MS: Negative for joint pain, low back pain.  Derm: Negative for rash or itching.  Neuro: Negative for weakness, abnormal sensation, seizure, frequent headaches, memory  loss,  confusion.  Psych: Negative for anxiety, depression, suicidal ideation, hallucinations.  Endo: Negative for unusual weight change.  Heme: Negative for bruising or bleeding. Allergy: Negative for rash or hives.  Physical Exam   BP (!) 164/82 (BP Location: Right Arm, Patient Position: Sitting, Cuff Size: Normal)   Pulse (!) 104   Temp 98 F (36.7 C) (Temporal)   Ht '5\' 9"'$  (1.753 m)   Wt 131 lb 12.8 oz (59.8 kg)   LMP 10/29/2021 (Exact Date)   SpO2 100%   BMI 19.46 kg/m    General: Well-nourished, well-developed in no acute distress.  Head: Normocephalic, atraumatic.   Eyes: Conjunctiva pink, no icterus. Mouth: Oropharyngeal mucosa moist and pink , no lesions erythema or exudate. Neck: Supple without thyromegaly, masses, or lymphadenopathy.  Lungs: Clear to auscultation bilaterally.  Heart: Regular rate and rhythm, no murmurs rubs or gallops.  Abdomen: Bowel sounds are normal, nontender, nondistended, no hepatosplenomegaly or masses,  no abdominal bruits or hernia, no rebound or guarding.   Rectal: not performed Extremities: No lower extremity edema. No clubbing or deformities.  Neuro: Alert and oriented x 4 , grossly normal neurologically.  Skin: Warm and dry, no rash or jaundice.   Psych: Alert and cooperative, normal mood and affect.  Labs   Labs from February 2023: White blood cell count 3300, hemoglobin 13.1, MCV 88, platelets 250,000, absolute neutrophils 1.6, sodium 141, potassium 3.9, BUN 10, creatinine 22, albumin 4.1, total bilirubin 1.6, alkaline phosphatase 61, AST 19, ALT 25.  Imaging Studies   No results found.  Assessment   RLQ pain: chronic in nature. More of a feeling of fullness with nagging discomfort but not severe pain. Symptoms present persistently for 3 years. No gyn cause found to explain symptoms. May be worse with less productive BMs or during cycle. Her father had colon polyps in his 9s. Her abdominal exam is unremarkable. Location of  discomfort is in right lower abdomen about 3 inches below her umbilicus. Since she had pelvic CT and not full abdomen/pelvic CT, I will view with radiology to make sure area of concern was adequately seen. Also to determine if her ileocecal area was seen, given appendix not seen on CT. If we need additional imaging we will proceed with imaging prior to ileocolonoscopy.     PLAN    Review pelvic CT with radiology.   Laureen Ochs. Bobby Rumpf, [place], [facility]

## 2021-11-19 NOTE — Progress Notes (Signed)
GI Office Note    Referring Provider: Burns Spain NP at [facility], [place] Primary Care Physician:  Rosine Door  Primary Gastroenterologist:  Chief Complaint   Chief Complaint  Patient presents with   Abdominal Pain    Feels like her abdomen is full in the right lower     History of Present Illness   Sara Gregory is a 36 y.o. female presenting today at the request Burns Spain NP for further evaluation of right lower quadrant abdominal pain.  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  Due to persistent right lower quadrant pain without known etiology, hematologist has recommended evaluation by GI including colonoscopy. She also has history of low absolute CD3 and CD4 count 6 with negative HIV/HTLV testing.  CT pelvis with contrast March 2023 showed no acute findings.  Appendix not visualized.  No pericecal inflammation. She has had at least two transvaginal ultrasounds that she reports unremarkable. One in 11/2018 and then another by her gyn in [place]. She states nothing has been found to explain her symptoms.   Symptoms present for about 3 years. She notes a fullness in RLQ. Associated with some discomfort, nagging type pain. Sometimes may be worse if does not have adequate BM but typically always present. Not necessarily worse with meals. Could be worse around time of her menstrual cycles. Her menses last about 7 days, occurring about every 35 days. Normal bleeding and mild cramping. She has a BM most days but never skips more than one day. She denies melena, brbpr. Sometimes has some extra gas but not always. No weight loss. No UGI symptoms. Patient has never had abdominal surgeries.   Medications   No current outpatient medications on file.   No current facility-administered medications for this visit.    Allergies   Allergies as of 11/19/2021   (No Known Allergies)     Past Medical History   Past Medical History:  Diagnosis Date   Neutropenia ([place])     Past Surgical History   Past Surgical History:  Procedure Laterality Date   none      Past Family History   Family History  Problem Relation Age of Onset   Breast cancer Mother    Bladder Cancer Father    Colon polyps Father        age less than 53   Colon cancer Neg Hx     Past Social History   Social History   Socioeconomic History   Marital status: Single    Spouse name: Not on file   Number of children: Not on file   Years of education: Not on file   Highest education level: Not on file  Occupational History   Not on file  Tobacco Use   Smoking status: Never    Passive exposure: Never   Smokeless tobacco: Never  Substance and Sexual Activity   Alcohol use: Not Currently   Drug use: Not Currently   Sexual activity: Not Currently  Other Topics Concern   Not on file  Social History Narrative   Not on file   Social Determinants of Health   Financial Resource Strain: Not on file  Food Insecurity: Not on file  Transportation Needs: Not on file  Physical Activity: Not on file  Stress: Not on file  Social Connections: Not on file  Intimate Partner Violence: Not on file    Review of  Systems   General: Negative for anorexia, weight loss, fever, chills, fatigue, weakness. Eyes: Negative for vision changes.  ENT: Negative for hoarseness, difficulty swallowing , nasal congestion. CV: Negative for chest pain, angina, palpitations, dyspnea on exertion, peripheral edema.  Respiratory: Negative for dyspnea at rest, dyspnea on exertion, cough, sputum, wheezing.  GI: See history of present illness. GU:  Negative for dysuria, hematuria, urinary incontinence, urinary frequency, nocturnal urination.  See hpi MS: Negative for joint pain, low back pain.  Derm: Negative for rash or itching.  Neuro: Negative for weakness, abnormal sensation, seizure, frequent headaches, memory  loss,  confusion.  Psych: Negative for anxiety, depression, suicidal ideation, hallucinations.  Endo: Negative for unusual weight change.  Heme: Negative for bruising or bleeding. Allergy: Negative for rash or hives.  Physical Exam   BP (!) 164/82 (BP Location: Right Arm, Patient Position: Sitting, Cuff Size: Normal)   Pulse (!) 104   Temp 98 F (36.7 C) (Temporal)   Ht '5\' 9"'$  (1.753 m)   Wt 131 lb 12.8 oz (59.8 kg)   LMP 10/29/2021 (Exact Date)   SpO2 100%   BMI 19.46 kg/m    General: Well-nourished, well-developed in no acute distress.  Head: Normocephalic, atraumatic.   Eyes: Conjunctiva pink, no icterus. Mouth: Oropharyngeal mucosa moist and pink , no lesions erythema or exudate. Neck: Supple without thyromegaly, masses, or lymphadenopathy.  Lungs: Clear to auscultation bilaterally.  Heart: Regular rate and rhythm, no murmurs rubs or gallops.  Abdomen: Bowel sounds are normal, nontender, nondistended, no hepatosplenomegaly or masses,  no abdominal bruits or hernia, no rebound or guarding.   Rectal: not performed Extremities: No lower extremity edema. No clubbing or deformities.  Neuro: Alert and oriented x 4 , grossly normal neurologically.  Skin: Warm and dry, no rash or jaundice.   Psych: Alert and cooperative, normal mood and affect.  Labs   Labs from February 2023: White blood cell count 3300, hemoglobin 13.1, MCV 88, platelets 250,000, absolute neutrophils 1.6, sodium 141, potassium 3.9, BUN 10, creatinine 22, albumin 4.1, total bilirubin 1.6, alkaline phosphatase 61, AST 19, ALT 25.  Imaging Studies   No results found.  Assessment   RLQ pain: chronic in nature. More of a feeling of fullness with nagging discomfort but not severe pain. Symptoms present persistently for 3 years. No gyn cause found to explain symptoms. May be worse with less productive BMs or during cycle. Her father had colon polyps in his 44s. Her abdominal exam is unremarkable. Location of  discomfort is in right lower abdomen about 3 inches below her umbilicus. Since she had pelvic CT and not full abdomen/pelvic CT, I will view with radiology to make sure area of concern was adequately seen. Also to determine if her ileocecal area was seen, given appendix not seen on CT. If we need additional imaging we will proceed with imaging prior to ileocolonoscopy.     PLAN    Review pelvic CT with radiology.   Laureen Ochs. Bobby Rumpf, [place], [facility]

## 2021-11-19 NOTE — Patient Instructions (Signed)
We will be in touch by the end of this week to let you know if area of concern was seen on previous CT scan. If not, we will arrange for additional abdominal/pelvic CT. If area was adequately seen, then we would recommend proceeding with colonoscopy as discussed today.

## 2021-11-21 ENCOUNTER — Telehealth: Payer: Self-pay | Admitting: Gastroenterology

## 2021-11-21 DIAGNOSIS — G8929 Other chronic pain: Secondary | ICD-10-CM

## 2021-11-21 NOTE — Telephone Encounter (Signed)
Please let pt know that I reviewed her CT with the radiologist. The area of her discomfort was well seen on pelvic CT. The portion of her bowel where her appendix would be is also seen on pelvic CT but the actual appendix is not visualized but no inflammation to suggest issues like appendicitis or crohn's.   At this point, I would recommend colonoscopy to further evaluate her rlq discomfort as recommended by her hematologist, and given family history of father having colonoscopy polyps at age less than 86. If agreeable, please schedule with Abbey Chatters. ASA 1.

## 2021-11-21 NOTE — Telephone Encounter (Signed)
Pt was made aware and verbalized understanding. Pt is agreeable with colonoscopy.

## 2021-11-22 MED ORDER — CLENPIQ 10-3.5-12 MG-GM -GM/175ML PO SOLN: 1.0000 | ORAL | 0 refills | Status: DC

## 2021-11-22 NOTE — Addendum Note (Signed)
Addended by: Cheron Every on: 11/22/2021 08:30 AM   Modules accepted: Orders

## 2021-11-22 NOTE — Telephone Encounter (Signed)
Called pt. She has been scheduled for 8/8 at 2:45pm. Aware will mail instructions. Rx sent to pharmacy. Will need preg test prior. She stated she may not be able to have the preg test done until the day prior. I advised make sure it's before 12pm. She voiced understanding.

## 2021-12-16 ENCOUNTER — Other Ambulatory Visit (HOSPITAL_COMMUNITY)
Admission: RE | Admit: 2021-12-16 | Discharge: 2021-12-16 | Disposition: A | Discharge status: Home / Self Care | Payer: 59 | Source: Ambulatory Visit | Attending: Internal Medicine | Admitting: Internal Medicine

## 2021-12-16 DIAGNOSIS — R1031 Right lower quadrant pain: Secondary | ICD-10-CM | POA: Insufficient documentation

## 2021-12-16 DIAGNOSIS — G8929 Other chronic pain: Secondary | ICD-10-CM | POA: Diagnosis not present

## 2021-12-16 LAB — PREGNANCY, URINE: Preg Test, Ur: NEGATIVE

## 2021-12-17 ENCOUNTER — Ambulatory Visit (HOSPITAL_COMMUNITY)
Admission: RE | Admit: 2021-12-17 | Discharge: 2021-12-17 | Disposition: A | Discharge status: Home / Self Care | Payer: 59 | Attending: Internal Medicine | Admitting: Internal Medicine

## 2021-12-17 ENCOUNTER — Encounter (HOSPITAL_COMMUNITY)
Admission: RE | Disposition: A | Discharge status: Home / Self Care | Payer: Self-pay | Source: Home / Self Care | Attending: Internal Medicine

## 2021-12-17 ENCOUNTER — Other Ambulatory Visit: Payer: Self-pay

## 2021-12-17 ENCOUNTER — Ambulatory Visit (HOSPITAL_BASED_OUTPATIENT_CLINIC_OR_DEPARTMENT_OTHER): Payer: 59 | Admitting: Anesthesiology

## 2021-12-17 ENCOUNTER — Encounter (HOSPITAL_COMMUNITY): Payer: Self-pay

## 2021-12-17 ENCOUNTER — Ambulatory Visit (HOSPITAL_COMMUNITY): Payer: 59 | Admitting: Anesthesiology

## 2021-12-17 DIAGNOSIS — K635 Polyp of colon: Secondary | ICD-10-CM | POA: Diagnosis not present

## 2021-12-17 DIAGNOSIS — R1031 Right lower quadrant pain: Secondary | ICD-10-CM | POA: Diagnosis not present

## 2021-12-17 DIAGNOSIS — Z8371 Family history of colonic polyps: Secondary | ICD-10-CM | POA: Diagnosis not present

## 2021-12-17 DIAGNOSIS — G8929 Other chronic pain: Secondary | ICD-10-CM | POA: Diagnosis not present

## 2021-12-17 DIAGNOSIS — Z800 Family history of malignant neoplasm of digestive organs: Secondary | ICD-10-CM | POA: Diagnosis not present

## 2021-12-17 DIAGNOSIS — D122 Benign neoplasm of ascending colon: Secondary | ICD-10-CM | POA: Diagnosis not present

## 2021-12-17 HISTORY — PX: COLONOSCOPY WITH PROPOFOL: SHX5780

## 2021-12-17 HISTORY — PX: POLYPECTOMY: SHX5525

## 2021-12-17 SURGERY — COLONOSCOPY WITH PROPOFOL
Anesthesia: General

## 2021-12-17 MED ORDER — PROPOFOL 500 MG/50ML IV EMUL
INTRAVENOUS | Status: DC | PRN
  Administered 2021-12-17: 200 ug/kg/min via INTRAVENOUS

## 2021-12-17 MED ORDER — PROPOFOL 10 MG/ML IV BOLUS
INTRAVENOUS | Status: DC | PRN
  Administered 2021-12-17 (×4): 20 mg via INTRAVENOUS

## 2021-12-17 MED ORDER — LIDOCAINE HCL (CARDIAC) PF 100 MG/5ML IV SOSY
PREFILLED_SYRINGE | INTRAVENOUS | Status: DC | PRN
  Administered 2021-12-17: 40 mg via INTRAVENOUS

## 2021-12-17 MED ORDER — LACTATED RINGERS IV SOLN: INTRAVENOUS | Status: DC

## 2021-12-17 NOTE — Op Note (Signed)
[facility] Patient Name: Sara Gregory Procedure Date: 12/17/2021 9:25 AM MRN: [id number] Date of Birth: Jul 17, 1985 Attending MD: Elon Alas. Abbey Chatters DO CSN: [id number] Age: 36 Admit Type: Outpatient Procedure:                Colonoscopy Indications:              Abdominal pain in the right lower quadrant Providers:                Elon Alas. Abbey Chatters, DO, Lambert Mody, Ladoris Gene, Technician, Aram Candela Referring MD:              Medicines:                See the Anesthesia note for documentation of the                            administered medications Complications:            No immediate complications. Estimated Blood Loss:     Estimated blood loss was minimal. Procedure:                Pre-Anesthesia Assessment:                           - The anesthesia plan was to use monitored                            anesthesia care (MAC).                           After obtaining informed consent, the colonoscope                            was passed under direct vision. Throughout the                            procedure, the patient's blood pressure, pulse, and                            oxygen saturations were monitored continuously. The                            [id number]-HQ190L ([id number]) scope was introduced through                            the anus and advanced to the the terminal ileum,                            with identification of the appendiceal orifice and                            IC valve. The colonoscopy was performed without                            difficulty. The  patient tolerated the procedure                            well. The quality of the bowel preparation was                            evaluated using the BBPS [facility] Bowel Preparation                            Scale) with scores of: Right Colon = 3, Transverse                            Colon = 3 and Left Colon = 3 (entire mucosa seen                            well with  no residual staining, small fragments of                            stool or opaque liquid). The total BBPS score                            equals 9. Scope In: 9:39:12 AM Scope Out: 9:55:42 AM Scope Withdrawal Time: 0 hours 11 minutes 33 seconds  Total Procedure Duration: 0 hours 16 minutes 30 seconds  Findings:      The perianal and digital rectal examinations were normal.      A 5 mm polyp was found in the ascending colon. The polyp was sessile.       The polyp was removed with a cold snare. Resection and retrieval were       complete.      The terminal ileum appeared normal.      The entire examined colon appeared normal. Impression:               - One 5 mm polyp in the ascending colon, removed                            with a cold snare. Resected and retrieved.                           - The examined portion of the ileum was normal.                           - The entire examined colon is normal. Moderate Sedation:      Per Anesthesia Care Recommendation:           - Patient has a contact number available for                            emergencies. The signs and symptoms of potential                            delayed complications were discussed with the  patient. Return to normal activities tomorrow.                            Written discharge instructions were provided to the                            patient.                           - Resume previous diet.                           - Continue present medications.                           - Await pathology results.                           - Repeat colonoscopy in 10 years for screening                            purposes.                           - Return to GI clinic in 3 months. Procedure Code(s):        --- Professional ---                           [phone], Colonoscopy, flexible; with removal of                            tumor(s), polyp(s), or other lesion(s) by snare                             technique Diagnosis Code(s):        --- Professional ---                           K63.5, Polyp of colon                           R10.31, Right lower quadrant pain CPT copyright 2019 American Medical Association. All rights reserved. The codes documented in this report are preliminary and upon coder review may  be revised to meet current compliance requirements. Elon Alas. Abbey Chatters, DO [place] Abbey Chatters, DO 12/17/2021 9:57:41 AM This report has been signed electronically. Number of Addenda: 0

## 2021-12-17 NOTE — Anesthesia Postprocedure Evaluation (Signed)
Anesthesia Post Note  Patient: Sara Gregory  Procedure(s) Performed: COLONOSCOPY WITH PROPOFOL POLYPECTOMY  Patient location during evaluation: Phase II Anesthesia Type: General Level of consciousness: awake and alert and oriented Pain management: pain level controlled Vital Signs Assessment: post-procedure vital signs reviewed and stable Respiratory status: spontaneous breathing, nonlabored ventilation and respiratory function stable Cardiovascular status: blood pressure returned to baseline and stable Postop Assessment: no apparent nausea or vomiting Anesthetic complications: no   No notable events documented.   Last Vitals:  Vitals:   12/17/21 0959 12/17/21 1001  BP: (!) 94/53 118/78  Pulse: 89   Resp: 20   Temp: 36.6 C   SpO2: 100%     Last Pain:  Vitals:   12/17/21 0959  TempSrc: Oral  PainSc:                  [name] [name]

## 2021-12-17 NOTE — Discharge Instructions (Addendum)
  Colonoscopy Discharge Instructions  Read the instructions outlined below and refer to this sheet in the next few weeks. These discharge instructions provide you with general information on caring for yourself after you leave the hospital. Your doctor may also give you specific instructions. While your treatment has been planned according to the most current medical practices available, unavoidable complications occasionally occur.   ACTIVITY You may resume your regular activity, but move at a slower pace for the next 24 hours.  Take frequent rest periods for the next 24 hours.  Walking will help get rid of the air and reduce the bloated feeling in your belly (abdomen).  No driving for 24 hours (because of the medicine (anesthesia) used during the test).   Do not sign any important legal documents or operate any machinery for 24 hours (because of the anesthesia used during the test).  NUTRITION Drink plenty of fluids.  You may resume your normal diet as instructed by your doctor.  Begin with a light meal and progress to your normal diet. Heavy or fried foods are harder to digest and may make you feel sick to your stomach (nauseated).  Avoid alcoholic beverages for 24 hours or as instructed.  MEDICATIONS You may resume your normal medications unless your doctor tells you otherwise.  WHAT YOU CAN EXPECT TODAY Some feelings of bloating in the abdomen.  Passage of more gas than usual.  Spotting of blood in your stool or on the toilet paper.  IF YOU HAD POLYPS REMOVED DURING THE COLONOSCOPY: No aspirin products for 7 days or as instructed.  No alcohol for 7 days or as instructed.  Eat a soft diet for the next 24 hours.  FINDING OUT THE RESULTS OF YOUR TEST Not all test results are available during your visit. If your test results are not back during the visit, make an appointment with your caregiver to find out the results. Do not assume everything is normal if you have not heard from your  caregiver or the medical facility. It is important for you to follow up on all of your test results.  SEEK IMMEDIATE MEDICAL ATTENTION IF: You have more than a spotting of blood in your stool.  Your belly is swollen (abdominal distention).  You are nauseated or vomiting.  You have a temperature over 101.  You have abdominal pain or discomfort that is severe or gets worse throughout the day.   Your colon looked very healthy today.  I did not see any active inflammation indicative of underlying inflammatory bowel disease such as Crohn's disease or ulcerative colitis.  You did have 1 small polyp which I removed.  This is likely benign, hyperplastic.  Await pathology results, my office will contact you.  Would recommend repeat colonoscopy in 10 years for screening purposes.  Follow-up with GI in 2 to 3 months.  Happy birthday!  I hope you have a great rest of your week!  Elon Alas. Abbey Chatters, D.O. Gastroenterology and Hepatology [facility]

## 2021-12-17 NOTE — Anesthesia Procedure Notes (Signed)
Procedure Name: MAC Date/Time: 12/17/2021 9:43 AM  Performed by: Lieutenant Diego, CRNAPre-anesthesia Checklist: Patient identified, Emergency Drugs available, Suction available, Patient being monitored and Timeout performed Patient Re-evaluated:Patient Re-evaluated prior to induction Oxygen Delivery Method: Non-rebreather mask Preoxygenation: Pre-oxygenation with 100% oxygen Induction Type: IV induction

## 2021-12-17 NOTE — Transfer of Care (Signed)
Immediate Anesthesia Transfer of Care Note  Patient: Sara Gregory  Procedure(s) Performed: COLONOSCOPY WITH PROPOFOL POLYPECTOMY  Patient Location: Endoscopy Unit  Anesthesia Type:MAC  Level of Consciousness: awake  Airway & Oxygen Therapy: Patient Spontanous Breathing  Post-op Assessment: Report given to RN and Post -op Vital signs reviewed and stable  Post vital signs: Reviewed and stable  Last Vitals:  Vitals Value Taken Time  BP 94/53 12/17/21 0959  Temp 36.6 C 12/17/21 0959  Pulse 89 12/17/21 0959  Resp 20 12/17/21 0959  SpO2 100 % 12/17/21 0959    Last Pain:  Vitals:   12/17/21 0959  TempSrc: Oral  PainSc:       Patients Stated Pain Goal: 6 (56/31/49 7026)  Complications: No notable events documented.

## 2021-12-17 NOTE — Interval H&P Note (Signed)
History and Physical Interval Note:  12/17/2021 9:05 AM  Sara Gregory  has presented today for surgery, with the diagnosis of RLQ abd pain, FH colon cancer.  The various methods of treatment have been discussed with the patient and family. After consideration of risks, benefits and other options for treatment, the patient has consented to  Procedure(s) with comments: COLONOSCOPY WITH PROPOFOL (N/A) - 2:45pm as a surgical intervention.  The patient's history has been reviewed, patient examined, no change in status, stable for surgery.  I have reviewed the patient's chart and labs.  Questions were answered to the patient's satisfaction.     Eloise Harman

## 2021-12-17 NOTE — Anesthesia Preprocedure Evaluation (Signed)
Anesthesia Evaluation  Patient identified by MRN, date of birth, ID band Patient awake    Reviewed: Allergy & Precautions, NPO status , Patient's Chart, lab work & pertinent test results  Airway Mallampati: II  TM Distance: >3 FB Neck ROM: Full    Dental  (+) Dental Advisory Given, Teeth Intact   Pulmonary neg pulmonary ROS,    Pulmonary exam normal breath sounds clear to auscultation       Cardiovascular negative cardio ROS Normal cardiovascular exam Rhythm:Regular Rate:Normal     Neuro/Psych negative neurological ROS  negative psych ROS   GI/Hepatic negative GI ROS, Neg liver ROS,   Endo/Other  negative endocrine ROS  Renal/GU negative Renal ROS  negative genitourinary   Musculoskeletal negative musculoskeletal ROS (+)   Abdominal   Peds negative pediatric ROS (+)  Hematology negative hematology ROS (+)   Anesthesia Other Findings   Reproductive/Obstetrics negative OB ROS                             Anesthesia Physical Anesthesia Plan  ASA: 1  Anesthesia Plan: General   Post-op Pain Management: Minimal or no pain anticipated   Induction: Intravenous  PONV Risk Score and Plan: Propofol infusion  Airway Management Planned: Nasal Cannula and Natural Airway  Additional Equipment:   Intra-op Plan:   Post-operative Plan:   Informed Consent: I have reviewed the patients History and Physical, chart, labs and discussed the procedure including the risks, benefits and alternatives for the proposed anesthesia with the patient or authorized representative who has indicated his/her understanding and acceptance.     Dental advisory given  Plan Discussed with: CRNA and Surgeon  Anesthesia Plan Comments:         Anesthesia Quick Evaluation

## 2021-12-18 LAB — SURGICAL PATHOLOGY

## 2021-12-20 ENCOUNTER — Encounter (HOSPITAL_COMMUNITY): Payer: Self-pay | Admitting: Internal Medicine

## 2022-02-17 ENCOUNTER — Ambulatory Visit: Payer: 59 | Admitting: Gastroenterology

## 2022-02-18 NOTE — Progress Notes (Unsigned)
GI Office Note    Referring Provider: Rosalee Kaufman, * Primary Care Physician:  Rosine Door  Primary Gastroenterologist:  Chief Complaint   No chief complaint on file.   History of Present Illness   Sara Gregory is a 36 y.o. female presenting today    CT pelvis with contrast March 2023 showed no acute findings.  Appendix not visualized.  No pericecal inflammation. She has had at least two transvaginal ultrasounds that she reports unremarkable. One in 11/2018 and then another by her gyn in [place]. She states nothing has been found to explain her symptoms.   Colonoscopy 12/2021: Single tubular adenoma removed. Next colonoscopy in 5 years    Medications   No current outpatient medications on file.   No current facility-administered medications for this visit.    Allergies   Allergies as of 02/19/2022   (No Known Allergies)     Past Medical History   Past Medical History:  Diagnosis Date   Neutropenia [facility])     Past Surgical History   Past Surgical History:  Procedure Laterality Date   COLONOSCOPY WITH PROPOFOL N/A 12/17/2021   Procedure: COLONOSCOPY WITH PROPOFOL;  Surgeon: Eloise Harman, DO;  Location: [facility];  Service: Endoscopy;  Laterality: N/A;  2:45pm   none     POLYPECTOMY  12/17/2021   Procedure: POLYPECTOMY;  Surgeon: Eloise Harman, DO;  Location: [facility];  Service: Endoscopy;;   WISDOM TOOTH EXTRACTION      Past Family History   Family History  Problem Relation Age of Onset   Breast cancer Mother    Bladder Cancer Father    Colon polyps Father        age less than 60   Colon cancer Neg Hx     Past Social History   Social History   Socioeconomic History   Marital status: Single    Spouse name: Not on file   Number of children: Not on file   Years of education: Not on file   Highest education level: Not on file  Occupational History   Not on file  Tobacco Use   Smoking status:  Never    Passive exposure: Never   Smokeless tobacco: Never  Vaping Use   Vaping Use: Never used  Substance and Sexual Activity   Alcohol use: Not Currently   Drug use: Not Currently   Sexual activity: Not Currently  Other Topics Concern   Not on file  Social History Narrative   Not on file   Social Determinants of Health   Financial Resource Strain: Not on file  Food Insecurity: Not on file  Transportation Needs: Not on file  Physical Activity: Not on file  Stress: Not on file  Social Connections: Not on file  Intimate Partner Violence: Not on file    Review of Systems   General: Negative for anorexia, weight loss, fever, chills, fatigue, weakness. ENT: Negative for hoarseness, difficulty swallowing , nasal congestion. CV: Negative for chest pain, angina, palpitations, dyspnea on exertion, peripheral edema.  Respiratory: Negative for dyspnea at rest, dyspnea on exertion, cough, sputum, wheezing.  GI: See history of present illness. GU:  Negative for dysuria, hematuria, urinary incontinence, urinary frequency, nocturnal urination.  Endo: Negative for unusual weight change.     Physical Exam   There were no vitals taken for this visit.   General: Well-nourished, well-developed in no acute distress.  Eyes: No icterus. Mouth: Oropharyngeal mucosa moist  and pink , no lesions erythema or exudate. Lungs: Clear to auscultation bilaterally.  Heart: Regular rate and rhythm, no murmurs rubs or gallops.  Abdomen: Bowel sounds are normal, nontender, nondistended, no hepatosplenomegaly or masses,  no abdominal bruits or hernia , no rebound or guarding.  Rectal: ***  Extremities: No lower extremity edema. No clubbing or deformities. Neuro: Alert and oriented x 4   Skin: Warm and dry, no jaundice.   Psych: Alert and cooperative, normal mood and affect.  Labs   *** Imaging Studies   No results found.  Assessment       PLAN   ***   Laureen Ochs. Bobby Rumpf, [place],  [facility]

## 2022-02-19 ENCOUNTER — Encounter: Payer: Self-pay | Admitting: Gastroenterology

## 2022-02-19 ENCOUNTER — Ambulatory Visit (INDEPENDENT_AMBULATORY_CARE_PROVIDER_SITE_OTHER): Payer: 59 | Admitting: Gastroenterology

## 2022-02-19 DIAGNOSIS — Z86010 Personal history of colonic polyps: Secondary | ICD-10-CM

## 2022-02-19 NOTE — Patient Instructions (Signed)
Please monitor for any change in your right lower abdominal symptoms. If you develop worsening fullness, pain, change in bowels, etc please let us know. Your next colonoscopy will be due in 12/2026. Return to the office as needed.

## 2022-07-23 DIAGNOSIS — D72819 Decreased white blood cell count, unspecified: Secondary | ICD-10-CM | POA: Diagnosis not present

## 2023-03-20 DIAGNOSIS — H5213 Myopia, bilateral: Secondary | ICD-10-CM | POA: Diagnosis not present

## 2023-03-20 DIAGNOSIS — H40013 Open angle with borderline findings, low risk, bilateral: Secondary | ICD-10-CM | POA: Diagnosis not present

## 2023-12-02 DIAGNOSIS — N926 Irregular menstruation, unspecified: Secondary | ICD-10-CM | POA: Diagnosis not present

## 2023-12-02 DIAGNOSIS — D72819 Decreased white blood cell count, unspecified: Secondary | ICD-10-CM | POA: Diagnosis not present

## 2023-12-02 DIAGNOSIS — E559 Vitamin D deficiency, unspecified: Secondary | ICD-10-CM | POA: Diagnosis not present

## 2023-12-02 DIAGNOSIS — Z0000 Encounter for general adult medical examination without abnormal findings: Secondary | ICD-10-CM | POA: Diagnosis not present

## 2023-12-02 DIAGNOSIS — R7989 Other specified abnormal findings of blood chemistry: Secondary | ICD-10-CM | POA: Diagnosis not present

## 2023-12-02 DIAGNOSIS — R5383 Other fatigue: Secondary | ICD-10-CM | POA: Diagnosis not present

## 2024-03-18 DIAGNOSIS — H5213 Myopia, bilateral: Secondary | ICD-10-CM | POA: Diagnosis not present
